# Patient Record
Sex: Female | Born: 1995 | Race: Black or African American | Hispanic: No | Marital: Single | Smoking: Current every day smoker
Health system: Southern US, Community
[De-identification: ages and names within clinical notes are randomized; demographics above are authoritative.]

---
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
## 2015-01-03 ENCOUNTER — Emergency Department
Admission: EM | Admit: 2015-01-03 | Discharge: 2015-01-03 | Disposition: A | Discharge status: Home / Self Care | Payer: Self-pay | Attending: Emergency Medicine | Admitting: Emergency Medicine

## 2015-01-03 ENCOUNTER — Encounter: Payer: Self-pay | Admitting: Emergency Medicine

## 2015-01-03 ENCOUNTER — Emergency Department: Payer: Self-pay

## 2015-01-03 DIAGNOSIS — Y998 Other external cause status: Secondary | ICD-10-CM | POA: Insufficient documentation

## 2015-01-03 DIAGNOSIS — Z79899 Other long term (current) drug therapy: Secondary | ICD-10-CM | POA: Insufficient documentation

## 2015-01-03 DIAGNOSIS — M7918 Myalgia, other site: Secondary | ICD-10-CM

## 2015-01-03 DIAGNOSIS — S3991XA Unspecified injury of abdomen, initial encounter: Secondary | ICD-10-CM | POA: Insufficient documentation

## 2015-01-03 DIAGNOSIS — S0990XA Unspecified injury of head, initial encounter: Secondary | ICD-10-CM | POA: Insufficient documentation

## 2015-01-03 DIAGNOSIS — M546 Pain in thoracic spine: Secondary | ICD-10-CM

## 2015-01-03 DIAGNOSIS — Y9389 Activity, other specified: Secondary | ICD-10-CM | POA: Insufficient documentation

## 2015-01-03 DIAGNOSIS — S299XXA Unspecified injury of thorax, initial encounter: Secondary | ICD-10-CM | POA: Insufficient documentation

## 2015-01-03 DIAGNOSIS — Y9289 Other specified places as the place of occurrence of the external cause: Secondary | ICD-10-CM | POA: Insufficient documentation

## 2015-01-03 MED ORDER — TRAMADOL HCL 50 MG PO TABS: 50.0000 mg | ORAL_TABLET | Freq: Four times a day (QID) | ORAL | Status: DC | PRN

## 2015-01-03 MED ORDER — TRAMADOL HCL 50 MG PO TABS
50.0000 mg | ORAL_TABLET | Freq: Once | ORAL | Status: AC
  Administered 2015-01-03: 50 mg via ORAL
  Filled 2015-01-03: qty 1
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
## 2015-01-03 NOTE — Discharge Instructions (Signed)
Back Pain, Adult °Low back pain is very common. About 1 in 5 people have back pain. The cause of low back pain is rarely dangerous. The pain often gets better over time. About half of people with a sudden onset of back pain feel better in just 2 weeks. About 8 in 10 people feel better by 6 weeks.  °CAUSES °Some common causes of back pain include: °· Strain of the muscles or ligaments supporting the spine. °· Wear and tear (degeneration) of the spinal discs. °· Arthritis. °· Direct injury to the back. °DIAGNOSIS °Most of the time, the direct cause of low back pain is not known. However, back pain can be treated effectively even when the exact cause of the pain is unknown. Answering your caregiver's questions about your overall health and symptoms is one of the most accurate ways to make sure the cause of your pain is not dangerous. If your caregiver needs more information, he or she may order lab work or imaging tests (X-rays or MRIs). However, even if imaging tests show changes in your back, this usually does not require surgery. °HOME CARE INSTRUCTIONS °For many people, back pain returns. Since low back pain is rarely dangerous, it is often a condition that people can learn to manage on their own.  °· Remain active. It is stressful on the back to sit or stand in one place. Do not sit, drive, or stand in one place for more than 30 minutes at a time. Take short walks on level surfaces as soon as pain allows. Try to increase the length of time you walk each day. °· Do not stay in bed. Resting more than 1 or 2 days can delay your recovery. °· Do not avoid exercise or work. Your body is made to move. It is not dangerous to be active, even though your back may hurt. Your back will likely heal faster if you return to being active before your pain is gone. °· Pay attention to your body when you  bend and lift. Many people have less discomfort when lifting if they bend their knees, keep the load close to their bodies, and  avoid twisting. Often, the most comfortable positions are those that put less stress on your recovering back. °· Find a comfortable position to sleep. Use a firm mattress and lie on your side with your knees slightly bent. If you lie on your back, put a pillow under your knees. °· Only take over-the-counter or prescription medicines as directed by your caregiver. Over-the-counter medicines to reduce pain and inflammation are often the most helpful. Your caregiver may prescribe muscle relaxant drugs. These medicines help dull your pain so you can more quickly return to your normal activities and healthy exercise. °· Put ice on the injured area. °· Put ice in a plastic bag. °· Place a towel between your skin and the bag. °· Leave the ice on for 15-20 minutes, 03-04 times a day for the first 2 to 3 days. After that, ice and heat may be alternated to reduce pain and spasms. °· Ask your caregiver about trying back exercises and gentle massage. This may be of some benefit. °· Avoid feeling anxious or stressed. Stress increases muscle tension and can worsen back pain. It is important to recognize when you are anxious or stressed and learn ways to manage it. Exercise is a great option. °SEEK MEDICAL CARE IF: °· You have pain that is not relieved with rest or medicine. °· You have pain that does not improve in 1 week. °· You have new symptoms. °· You are generally not feeling well. °SEEK   IMMEDIATE MEDICAL CARE IF:  °· You have pain that radiates from your back into your legs. °· You develop new bowel or bladder control problems. °· You have unusual weakness or numbness in your arms or legs. °· You develop nausea or vomiting. °· You develop abdominal pain. °· You feel faint. °Document Released: 05/28/2005 Document Revised: 11/27/2011 Document Reviewed: 09/29/2013 °ExitCare® Patient Information ©2015 [employer]. This information is not intended to replace advice given to you by your health care provider. Make sure you  discuss any questions you have with your health care provider. ° °Motor Vehicle Collision °It is common to have multiple bruises and sore muscles after a motor vehicle collision (MVC). These tend to feel worse for the first 24 hours. You may have the most stiffness and soreness over the first several hours. You may also feel worse when you wake up the first morning after your collision. After this point, you will usually begin to improve with each day. The speed of improvement often depends on the severity of the collision, the number of injuries, and the location and nature of these injuries. °HOME CARE INSTRUCTIONS °· Put ice on the injured area. °¨ Put ice in a plastic bag. °¨ Place a towel between your skin and the bag. °¨ Leave the ice on for 15-20 minutes, 3-4 times a day, or as directed by your health care provider. °· Drink enough fluids to keep your urine clear or pale yellow. Do not drink alcohol. °· Take a warm shower or bath once or twice a day. This will increase blood flow to sore muscles. °· You may return to activities as directed by your caregiver. Be careful when lifting, as this may aggravate neck or back pain. °· Only take over-the-counter or prescription medicines for pain, discomfort, or fever as directed by your caregiver. Do not use aspirin. This may increase bruising and bleeding. °SEEK IMMEDIATE MEDICAL CARE IF: °· You have numbness, tingling, or weakness in the arms or legs. °· You develop severe headaches not relieved with medicine. °· You have severe neck pain, especially tenderness in the middle of the back of your neck. °· You have changes in bowel or bladder control. °· There is increasing pain in any area of the body. °· You have shortness of breath, light-headedness, dizziness, or fainting. °· You have chest pain. °· You feel sick to your stomach (nauseous), throw up (vomit), or sweat. °· You have increasing abdominal discomfort. °· There is blood in your urine, stool, or  vomit. °· You have pain in your shoulder (shoulder strap areas). °· You feel your symptoms are getting worse. °MAKE SURE YOU: °· Understand these instructions. °· Will watch your condition. °· Will get help right away if you are not doing well or get worse. °Document Released: 05/28/2005 Document Revised: 10/12/2013 Document Reviewed: 10/25/2010 °ExitCare® Patient Information ©2015 [employer]. This information is not intended to replace advice given to you by your health care provider. Make sure you discuss any questions you have with your health care provider. ° °Musculoskeletal Pain °Musculoskeletal pain is muscle and boney aches and pains. These pains can occur in any part of the body. Your caregiver may treat you without knowing the cause of the pain. They may treat you if blood or urine tests, X-rays, and other tests were normal.  °CAUSES °There is often not a definite cause or reason for these pains. These pains may be caused by a type of germ (virus). The discomfort may also   come from overuse. Overuse includes working out too hard when your body is not fit. Boney aches also come from weather changes. Bone is sensitive to atmospheric pressure changes. °HOME CARE INSTRUCTIONS  °· Ask when your test results will be ready. Make sure you get your test results. °· Only take over-the-counter or prescription medicines for pain, discomfort, or fever as directed by your caregiver. If you were given medications for your condition, do not drive, operate machinery or power tools, or sign legal documents for 24 hours. Do not drink alcohol. Do not take sleeping pills or other medications that may interfere with treatment. °· Continue all activities unless the activities cause more pain. When the pain lessens, slowly resume normal activities. Gradually increase the intensity and duration of the activities or exercise. °· During periods of severe pain, bed rest may be helpful. Lay or sit in any position that is  comfortable. °· Putting ice on the injured area. °¨ Put ice in a bag. °¨ Place a towel between your skin and the bag. °¨ Leave the ice on for 15 to 20 minutes, 3 to 4 times a day. °· Follow up with your caregiver for continued problems and no reason can be found for the pain. If the pain becomes worse or does not go away, it may be necessary to repeat tests or do additional testing. Your caregiver may need to look further for a possible cause. °SEEK IMMEDIATE MEDICAL CARE IF: °· You have pain that is getting worse and is not relieved by medications. °· You develop chest pain that is associated with shortness or breath, sweating, feeling sick to your stomach (nauseous), or throw up (vomit). °· Your pain becomes localized to the abdomen. °· You develop any new symptoms that seem different or that concern you. °MAKE SURE YOU:  °· Understand these instructions. °· Will watch your condition. °· Will get help right away if you are not doing well or get worse. °Document Released: 05/28/2005 Document Revised: 08/20/2011 Document Reviewed: 01/30/2013 °ExitCare® Patient Information ©2015 [employer]. This information is not intended to replace advice given to you by your health care provider. Make sure you discuss any questions you have with your health care provider. ° °

## 2016-06-15 DIAGNOSIS — Z91410 Personal history of adult physical and sexual abuse: Secondary | ICD-10-CM | POA: Insufficient documentation
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
## 2018-02-24 DIAGNOSIS — F419 Anxiety disorder, unspecified: Secondary | ICD-10-CM | POA: Insufficient documentation

## 2018-05-27 LAB — HM HIV SCREENING LAB: HM HIV Screening: NEGATIVE

## 2018-07-08 DIAGNOSIS — E669 Obesity, unspecified: Secondary | ICD-10-CM | POA: Insufficient documentation

## 2018-07-08 DIAGNOSIS — Z8659 Personal history of other mental and behavioral disorders: Secondary | ICD-10-CM | POA: Insufficient documentation

## 2018-07-08 LAB — HM PAP SMEAR: HM Pap smear: NEGATIVE

## 2018-07-23 ENCOUNTER — Ambulatory Visit: Payer: Self-pay | Admitting: Physician Assistant

## 2018-07-23 NOTE — Progress Notes (Deleted)
Patient: Gabriella MosherReginnaria Lewis, Female    DOB: 04/02/1996, 23 y.o.   MRN: [id number] Visit Date: 07/23/2018  Today's Provider: Trey SailorsAdriana M Pollak, PA-C   No chief complaint on file.  Subjective:     Annual physical exam Gabriella Mayford Knifeurner is a 23 y.o. female who presents today for health maintenance and complete physical. She feels {DESC; WELL/FAIRLY WELL/POORLY:18703}. She reports exercising ***. She reports she is sleeping {DESC; WELL/FAIRLY WELL/POORLY:18703}.  -----------------------------------------------------------------   Review of Systems  Constitutional: Negative.   HENT: Negative.   Eyes: Negative.   Respiratory: Negative.   Cardiovascular: Negative.   Gastrointestinal: Negative.   Endocrine: Negative.   Genitourinary: Negative.   Musculoskeletal: Negative.   Skin: Negative.   Allergic/Immunologic: Negative.   Neurological: Negative.   Hematological: Negative.   Psychiatric/Behavioral: Negative.     Social History      She         Social History   Socioeconomic History  . Marital status: Not on file    Spouse name: Not on file  . Number of children: Not on file  . Years of education: Not on file  . Highest education level: Not on file  Occupational History  . Not on file  Social Needs  . Financial resource strain: Not on file  . Food insecurity:    Worry: Not on file    Inability: Not on file  . Transportation needs:    Medical: Not on file    Non-medical: Not on file  Tobacco Use  . Smoking status: Not on file  Substance and Sexual Activity  . Alcohol use: Not on file  . Drug use: Not on file  . Sexual activity: Not on file  Lifestyle  . Physical activity:    Days per week: Not on file    Minutes per session: Not on file  . Stress: Not on file  Relationships  . Social connections:    Talks on phone: Not on file    Gets together: Not on file    Attends religious service: Not on file    Active member of club or organization: Not on  file    Attends meetings of clubs or organizations: Not on file    Relationship status: Not on file  Other Topics Concern  . Not on file  Social History Narrative  . Not on file    No past medical history on file.   There are no active problems to display for this patient.   *** The histories are not reviewed yet. Please review them in the "History" navigator section and refresh this SmartLink.  Family History        No family status information on file.        Her family history is not on file.      Allergies not on file  No current outpatient medications on file.   No care team member to display    Objective:    Vitals: There were no vitals taken for this visit.  There were no vitals filed for this visit.   Physical Exam   Depression Screen No flowsheet data found.     Assessment & Plan:     Routine Health Maintenance and Physical Exam  Exercise Activities and Dietary recommendations Goals   None      There is no immunization history on file for this patient.  There are no preventive care reminders to display for this patient.   Discussed  health benefits of physical activity, and encouraged her to engage in regular exercise appropriate for her age and condition.    --------------------------------------------------------------------    Trey Sailors, PA-C  [facility]

## 2018-12-04 DIAGNOSIS — Z8659 Personal history of other mental and behavioral disorders: Secondary | ICD-10-CM

## 2018-12-04 DIAGNOSIS — Z6833 Body mass index (BMI) 33.0-33.9, adult: Secondary | ICD-10-CM

## 2018-12-04 DIAGNOSIS — E669 Obesity, unspecified: Secondary | ICD-10-CM

## 2018-12-04 DIAGNOSIS — F419 Anxiety disorder, unspecified: Secondary | ICD-10-CM

## 2018-12-04 DIAGNOSIS — Z91410 Personal history of adult physical and sexual abuse: Secondary | ICD-10-CM

## 2018-12-05 ENCOUNTER — Encounter: Payer: Self-pay | Admitting: Emergency Medicine

## 2018-12-08 ENCOUNTER — Ambulatory Visit: Payer: Self-pay

## 2019-01-23 ENCOUNTER — Ambulatory Visit: Payer: Medicaid Other

## 2019-01-27 ENCOUNTER — Ambulatory Visit: Payer: Self-pay | Admitting: Nurse Practitioner

## 2019-01-27 ENCOUNTER — Encounter: Payer: Self-pay | Admitting: Nurse Practitioner

## 2019-01-27 ENCOUNTER — Other Ambulatory Visit: Payer: Self-pay

## 2019-01-27 DIAGNOSIS — Z113 Encounter for screening for infections with a predominantly sexual mode of transmission: Secondary | ICD-10-CM

## 2019-01-27 DIAGNOSIS — N760 Acute vaginitis: Secondary | ICD-10-CM

## 2019-01-27 DIAGNOSIS — B9689 Other specified bacterial agents as the cause of diseases classified elsewhere: Secondary | ICD-10-CM

## 2019-01-27 MED ORDER — METRONIDAZOLE 500 MG PO TABS: 500.0000 mg | ORAL_TABLET | Freq: Two times a day (BID) | ORAL | 0 refills | Status: AC
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
## 2019-01-27 NOTE — Progress Notes (Signed)
Here today for STD screening. Accepts bloodwork.  Aarian Griffie, RN  Wet Prep results reviewed. Patient treated per provider orders. Keian Odriscoll, RN    

## 2019-01-30 LAB — WET PREP FOR TRICH, YEAST, CLUE
Trichomonas Exam: NEGATIVE
Yeast Exam: NEGATIVE

## 2019-03-18 ENCOUNTER — Ambulatory Visit: Payer: Medicaid Other

## 2019-04-01 ENCOUNTER — Ambulatory Visit: Payer: Medicaid Other

## 2019-04-17 ENCOUNTER — Ambulatory Visit: Payer: Medicaid Other

## 2019-04-20 ENCOUNTER — Ambulatory Visit: Payer: Medicaid Other

## 2019-04-24 ENCOUNTER — Ambulatory Visit: Payer: Self-pay | Admitting: Physician Assistant

## 2019-04-24 ENCOUNTER — Other Ambulatory Visit: Payer: Self-pay

## 2019-04-24 DIAGNOSIS — Z113 Encounter for screening for infections with a predominantly sexual mode of transmission: Secondary | ICD-10-CM

## 2019-04-24 LAB — WET PREP FOR TRICH, YEAST, CLUE
Trichomonas Exam: NEGATIVE
Yeast Exam: NEGATIVE

## 2019-04-24 LAB — PREGNANCY, URINE: Preg Test, Ur: NEGATIVE

## 2019-04-24 NOTE — Progress Notes (Signed)
UPT is negative today. Wet mount reviewed with provider and no treatment needed for wet mount per Antoine Primas, PA verbal order. Provider orders completed.Ronny Bacon, RN

## 2019-04-25 ENCOUNTER — Encounter: Payer: Self-pay | Admitting: Physician Assistant
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
## 2019-06-10 ENCOUNTER — Other Ambulatory Visit: Payer: Self-pay

## 2019-06-10 ENCOUNTER — Ambulatory Visit (LOCAL_COMMUNITY_HEALTH_CENTER): Payer: Medicaid Other | Admitting: Family Medicine

## 2019-06-10 ENCOUNTER — Encounter: Payer: Self-pay | Admitting: Family Medicine

## 2019-06-10 VITALS — BP 132/86 | Ht 61.0 in | Wt 184.0 lb

## 2019-06-10 DIAGNOSIS — N923 Ovulation bleeding: Secondary | ICD-10-CM

## 2019-06-10 DIAGNOSIS — Z3009 Encounter for other general counseling and advice on contraception: Secondary | ICD-10-CM | POA: Diagnosis not present

## 2019-06-10 DIAGNOSIS — B9689 Other specified bacterial agents as the cause of diseases classified elsewhere: Secondary | ICD-10-CM

## 2019-06-10 DIAGNOSIS — N760 Acute vaginitis: Secondary | ICD-10-CM

## 2019-06-10 DIAGNOSIS — Z113 Encounter for screening for infections with a predominantly sexual mode of transmission: Secondary | ICD-10-CM

## 2019-06-10 LAB — WET PREP FOR TRICH, YEAST, CLUE
Trichomonas Exam: NEGATIVE
Yeast Exam: NEGATIVE

## 2019-06-10 LAB — PREGNANCY, URINE: Preg Test, Ur: NEGATIVE

## 2019-06-10 MED ORDER — METRONIDAZOLE 500 MG PO TABS: 500.0000 mg | ORAL_TABLET | Freq: Two times a day (BID) | ORAL | 0 refills | Status: AC

## 2019-06-10 MED ORDER — NORGESTIMATE-ETH ESTRADIOL 0.25-35 MG-MCG PO TABS: 1.0000 | ORAL_TABLET | Freq: Every day | ORAL | 0 refills | Status: AC

## 2019-06-10 NOTE — Progress Notes (Signed)
WH problem visit  [facility]  Subjective:  Gabriella Norris is a 23 y.o. being seen today for   Chief Complaint  Patient presents with  . SEXUALLY TRANSMITTED DISEASE    STD screening  . Gynecologic Exam    concerns about menstraul bleeding    HPI  Client states that she was sen 04/2019 for discharge- states testing was negative.  States she continued to have disch with odor since 04/2019.  Client is concerned about her bleeding.  LNMP 04/27/2019  She states her menses began 05/25/2019 but it was just spotting and lighter.  On 12/20/202 states blood flow was heavier and she began to have nickel-sized clots today,  She denies cramping, lower abd pain, fever/n/v. Client states that she has 1 partner.  Last unprotected sex was end of November.   She did note that she had pain with sex.  Client states that she and partner may want to conceive in the next year so don't use birth control.     Does the patient have a current or past history of drug use? No   No components found for: HCV]   Health Maintenance Due  Topic Date Due  . TETANUS/TDAP  04/12/2015  . INFLUENZA VACCINE  01/10/2019    ROS  The following portions of the patient's history were reviewed and updated as appropriate: allergies, current medications, past family history, past medical history, past social history, past surgical history and problem list. Problem list updated.   See flowsheet for other program required questions.  Objective:   Vitals:   06/10/19 0857  BP: 132/86  Weight: 184 lb (83.5 kg)  Height: 5\' 1"  (1.549 m)    Physical Exam Constitutional:      Appearance: She is obese.  Abdominal:     Palpations: Abdomen is soft. There is no mass.     Tenderness: There is no abdominal tenderness.     Hernia: There is no hernia in the left inguinal area or right inguinal area.  Genitourinary:    Pubic Area: No rash or pubic lice.      Labia:      Right: No rash, tenderness, lesion or injury.        Left: No rash, tenderness, lesion or injury.      Urethra: No prolapse or urethral lesion.     Vagina: Bleeding present. No tenderness or lesions.     Cervix: No cervical motion tenderness.     Uterus: Normal.      Adnexa: Right adnexa normal and left adnexa normal.  Musculoskeletal:     Cervical back: Neck supple. No tenderness.  Lymphadenopathy:     Cervical: No cervical adenopathy.     Lower Body: No right inguinal adenopathy. No left inguinal adenopathy.  Skin:    General: Skin is warm and dry.     Findings: No lesion or rash.  Neurological:     Mental Status: She is alert.     Assessment and Plan:  Gabriella Norris is a 23 y.o. female presenting to the [facility] Department for a Women's Health problem visit  1. General counseling and advice on contraceptive management  2. Screening examination for venereal disease - WET PREP FOR TRICH, YEAST, CLUE - Chlamydia/Gonorrhea [place] [facility]  3. Intermenstrual bleeding - Pregnancy, urine-  Negative Offered client OCPs to treat the bleeding.  Client agrees to this plan.  Sprintec take 1 active tablet x 6 weeks.  Explained  to client not to take the 4th week of the OCP pack.  Co to use condoms for back-up.  If client decides to continue with OCPs for [facility] she will contact clinic for RF.  4. BV (bacterial vaginosis) - metroNIDAZOLE (FLAGYL) 500 MG tablet; Take 1 tablet (500 mg total) by mouth 2 (two) times daily for 7 days.  Dispense: 14 tablet; Refill: 0     No follow-ups on file.  No future appointments.  Hassell Done, FNP

## 2019-06-10 NOTE — Progress Notes (Signed)
Pt here as she is having concerns with menstrual bleeding that started 05/25/2019 and is still having light bleeding now. Pt also reports odor and interest in STD screening. Pt reports she is not interested in birth control at this time and is not currently using a BCM.Marland KitchenRonny Bacon, RN

## 2019-06-10 NOTE — Progress Notes (Signed)
Urine PT is negative today. Wet mount reviewed and pt treated for BV per standing order and per Hassell Done, FNP order. Pt received Sprintec #2 packs per provider order. Counseled pt per provider orders and pt states understanding. Provider orders completed.Ronny Bacon, RN

## 2019-09-17 ENCOUNTER — Ambulatory Visit: Payer: Medicaid Other

## 2019-09-21 ENCOUNTER — Ambulatory Visit: Payer: Medicaid Other

## 2019-12-02 ENCOUNTER — Ambulatory Visit: Payer: Medicaid Other

## 2019-12-03 ENCOUNTER — Ambulatory Visit: Payer: Medicaid Other

## 2019-12-31 ENCOUNTER — Ambulatory Visit: Payer: Medicaid Other

## 2020-03-07 ENCOUNTER — Ambulatory Visit: Payer: Self-pay | Admitting: Advanced Practice Midwife

## 2020-03-07 ENCOUNTER — Encounter: Payer: Self-pay | Admitting: Advanced Practice Midwife

## 2020-03-07 ENCOUNTER — Other Ambulatory Visit: Payer: Self-pay

## 2020-03-07 DIAGNOSIS — Z113 Encounter for screening for infections with a predominantly sexual mode of transmission: Secondary | ICD-10-CM

## 2020-03-07 DIAGNOSIS — F17200 Nicotine dependence, unspecified, uncomplicated: Secondary | ICD-10-CM

## 2020-03-07 DIAGNOSIS — B9689 Other specified bacterial agents as the cause of diseases classified elsewhere: Secondary | ICD-10-CM

## 2020-03-07 DIAGNOSIS — N760 Acute vaginitis: Secondary | ICD-10-CM

## 2020-03-07 LAB — WET PREP FOR TRICH, YEAST, CLUE
Trichomonas Exam: NEGATIVE
Yeast Exam: NEGATIVE

## 2020-03-07 MED ORDER — METRONIDAZOLE 500 MG PO TABS: 500.0000 mg | ORAL_TABLET | Freq: Two times a day (BID) | ORAL | 0 refills | Status: AC
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
## 2020-03-07 NOTE — Progress Notes (Signed)
Post:  RN reviewed wet mount with patient. Patient treated for BV for S.O. Provider orders complete.  Harvie Heck, RN

## 2020-05-28 ENCOUNTER — Encounter: Payer: Self-pay | Admitting: Emergency Medicine

## 2020-05-28 ENCOUNTER — Other Ambulatory Visit: Payer: Self-pay

## 2020-05-28 ENCOUNTER — Emergency Department: Payer: Self-pay

## 2020-05-28 ENCOUNTER — Emergency Department
Admission: EM | Admit: 2020-05-28 | Discharge: 2020-05-28 | Disposition: A | Discharge status: Home / Self Care | Payer: Self-pay | Attending: Emergency Medicine | Admitting: Emergency Medicine

## 2020-05-28 DIAGNOSIS — F17210 Nicotine dependence, cigarettes, uncomplicated: Secondary | ICD-10-CM | POA: Insufficient documentation

## 2020-05-28 DIAGNOSIS — J101 Influenza due to other identified influenza virus with other respiratory manifestations: Secondary | ICD-10-CM | POA: Insufficient documentation

## 2020-05-28 DIAGNOSIS — Z20822 Contact with and (suspected) exposure to covid-19: Secondary | ICD-10-CM | POA: Insufficient documentation

## 2020-05-28 LAB — CBC WITH DIFFERENTIAL/PLATELET
Abs Immature Granulocytes: 0.03 10*3/uL (ref 0.00–0.07)
Basophils Absolute: 0 10*3/uL (ref 0.0–0.1)
Basophils Relative: 0 %
Eosinophils Absolute: 0 10*3/uL (ref 0.0–0.5)
Eosinophils Relative: 1 %
HCT: 38.2 % (ref 36.0–46.0)
Hemoglobin: 12.8 g/dL (ref 12.0–15.0)
Immature Granulocytes: 0 %
Lymphocytes Relative: 8 %
Lymphs Abs: 0.6 10*3/uL — ABNORMAL LOW (ref 0.7–4.0)
MCH: 29.2 pg (ref 26.0–34.0)
MCHC: 33.5 g/dL (ref 30.0–36.0)
MCV: 87 fL (ref 80.0–100.0)
Monocytes Absolute: 0.6 10*3/uL (ref 0.1–1.0)
Monocytes Relative: 8 %
Neutro Abs: 6.5 10*3/uL (ref 1.7–7.7)
Neutrophils Relative %: 83 %
Platelets: 310 10*3/uL (ref 150–400)
RBC: 4.39 MIL/uL (ref 3.87–5.11)
RDW: 12.9 % (ref 11.5–15.5)
WBC: 7.9 10*3/uL (ref 4.0–10.5)
nRBC: 0 % (ref 0.0–0.2)

## 2020-05-28 LAB — BASIC METABOLIC PANEL
Anion gap: 8 (ref 5–15)
BUN: 10 mg/dL (ref 6–20)
CO2: 23 mmol/L (ref 22–32)
Calcium: 8.2 mg/dL — ABNORMAL LOW (ref 8.9–10.3)
Chloride: 105 mmol/L (ref 98–111)
Creatinine, Ser: 0.75 mg/dL (ref 0.44–1.00)
GFR, Estimated: >60 mL/min (ref >60)
Glucose, Bld: 89 mg/dL (ref 70–99)
Potassium: 3.8 mmol/L (ref 3.5–5.1)
Sodium: 136 mmol/L (ref 135–145)

## 2020-05-28 LAB — RESP PANEL BY RT-PCR (FLU A&B, COVID) ARPGX2
Influenza A by PCR: POSITIVE — AB
Influenza B by PCR: NEGATIVE
SARS Coronavirus 2 by RT PCR: NEGATIVE

## 2020-05-28 MED ORDER — OSELTAMIVIR PHOSPHATE 75 MG PO CAPS
75.0000 mg | ORAL_CAPSULE | Freq: Once | ORAL | Status: AC
  Administered 2020-05-28: 22:00:00 75 mg via ORAL
  Filled 2020-05-28: qty 1

## 2020-05-28 MED ORDER — OSELTAMIVIR PHOSPHATE 75 MG PO CAPS: 75.0000 mg | ORAL_CAPSULE | Freq: Two times a day (BID) | ORAL | 0 refills | Status: AC

## 2020-05-28 MED ORDER — ACETAMINOPHEN 500 MG PO TABS
ORAL_TABLET | ORAL | Status: AC
  Filled 2020-05-28: qty 2

## 2020-05-28 MED ORDER — ACETAMINOPHEN 500 MG PO TABS
1000.0000 mg | ORAL_TABLET | Freq: Once | ORAL | Status: AC
  Administered 2020-05-28: 20:00:00 1000 mg via ORAL

## 2020-05-28 MED ORDER — IBUPROFEN 600 MG PO TABS: 600.0000 mg | ORAL_TABLET | Freq: Four times a day (QID) | ORAL | 0 refills | Status: AC | PRN

## 2020-05-28 MED ORDER — BENZONATATE 100 MG PO CAPS: 100.0000 mg | ORAL_CAPSULE | Freq: Three times a day (TID) | ORAL | 0 refills | Status: AC | PRN

## 2020-05-28 MED ORDER — SODIUM CHLORIDE 0.9 % IV BOLUS
1000.0000 mL | Freq: Once | INTRAVENOUS | Status: AC
  Administered 2020-05-28: 20:00:00 1000 mL via INTRAVENOUS
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
## 2020-05-28 NOTE — ED Triage Notes (Signed)
Pt reports yesterday started with cough, fever and bodyaches

## 2020-08-10 ENCOUNTER — Ambulatory Visit: Payer: Medicaid Other

## 2021-01-20 ENCOUNTER — Ambulatory Visit: Payer: Medicaid Other

## 2021-02-08 ENCOUNTER — Encounter: Payer: Self-pay | Admitting: Family Medicine

## 2021-02-08 ENCOUNTER — Ambulatory Visit: Payer: Self-pay | Admitting: Family Medicine

## 2021-02-08 ENCOUNTER — Other Ambulatory Visit: Payer: Self-pay

## 2021-02-08 DIAGNOSIS — Z113 Encounter for screening for infections with a predominantly sexual mode of transmission: Secondary | ICD-10-CM

## 2021-02-08 DIAGNOSIS — A599 Trichomoniasis, unspecified: Secondary | ICD-10-CM

## 2021-02-08 LAB — WET PREP FOR TRICH, YEAST, CLUE
Trichomonas Exam: POSITIVE — AB
Yeast Exam: NEGATIVE

## 2021-02-08 MED ORDER — METRONIDAZOLE 500 MG PO TABS: 500.0000 mg | ORAL_TABLET | Freq: Two times a day (BID) | ORAL | 0 refills | Status: AC
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
## 2021-02-12 NOTE — Progress Notes (Signed)
Chart reviewed by Pharmacist  Suzanne Walker PharmD, Contract Pharmacist at [facility]  

## 2021-05-02 ENCOUNTER — Ambulatory Visit: Payer: Medicaid Other

## 2021-05-25 ENCOUNTER — Ambulatory Visit: Payer: Medicaid Other

## 2021-07-14 ENCOUNTER — Encounter: Payer: Self-pay | Admitting: Family Medicine

## 2021-07-14 ENCOUNTER — Other Ambulatory Visit: Payer: Self-pay

## 2021-07-14 ENCOUNTER — Ambulatory Visit: Payer: Self-pay | Admitting: Family Medicine

## 2021-07-14 DIAGNOSIS — B9689 Other specified bacterial agents as the cause of diseases classified elsewhere: Secondary | ICD-10-CM

## 2021-07-14 DIAGNOSIS — N760 Acute vaginitis: Secondary | ICD-10-CM

## 2021-07-14 DIAGNOSIS — Z113 Encounter for screening for infections with a predominantly sexual mode of transmission: Secondary | ICD-10-CM

## 2021-07-14 LAB — WET PREP FOR TRICH, YEAST, CLUE
Trichomonas Exam: NEGATIVE
Yeast Exam: NEGATIVE

## 2021-07-14 MED ORDER — METRONIDAZOLE 500 MG PO TABS: 500.0000 mg | ORAL_TABLET | Freq: Two times a day (BID) | ORAL | 0 refills | Status: AC

## 2021-07-14 NOTE — Patient Instructions (Signed)
Steps to prevent BV and yeast: Wear all-cotton underwear Sleep without underwear Take showers instead of baths Wear loose fitting clothing, especially during warm/hot weather Use a hair dryer on low after bathing to dry the area Avoid scented soaps and body washes Do not douche May try over the counter probiotics or boric acid gel or suppositories Stop smoking  

## 2021-07-14 NOTE — Progress Notes (Signed)
Pt here for STD screening.  Wet mount results reviewed and medication dispensed, per provider orders.  Pt declined condoms.  Berdie Ogren, RN
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
## 2022-01-02 IMAGING — DX DG CHEST 1V
1 series · 1 of 1 positions shown · non-contrast
Comparison: None.

CLINICAL DATA: Fever, cough

EXAM:
CHEST  1 VIEW

[chest ap]
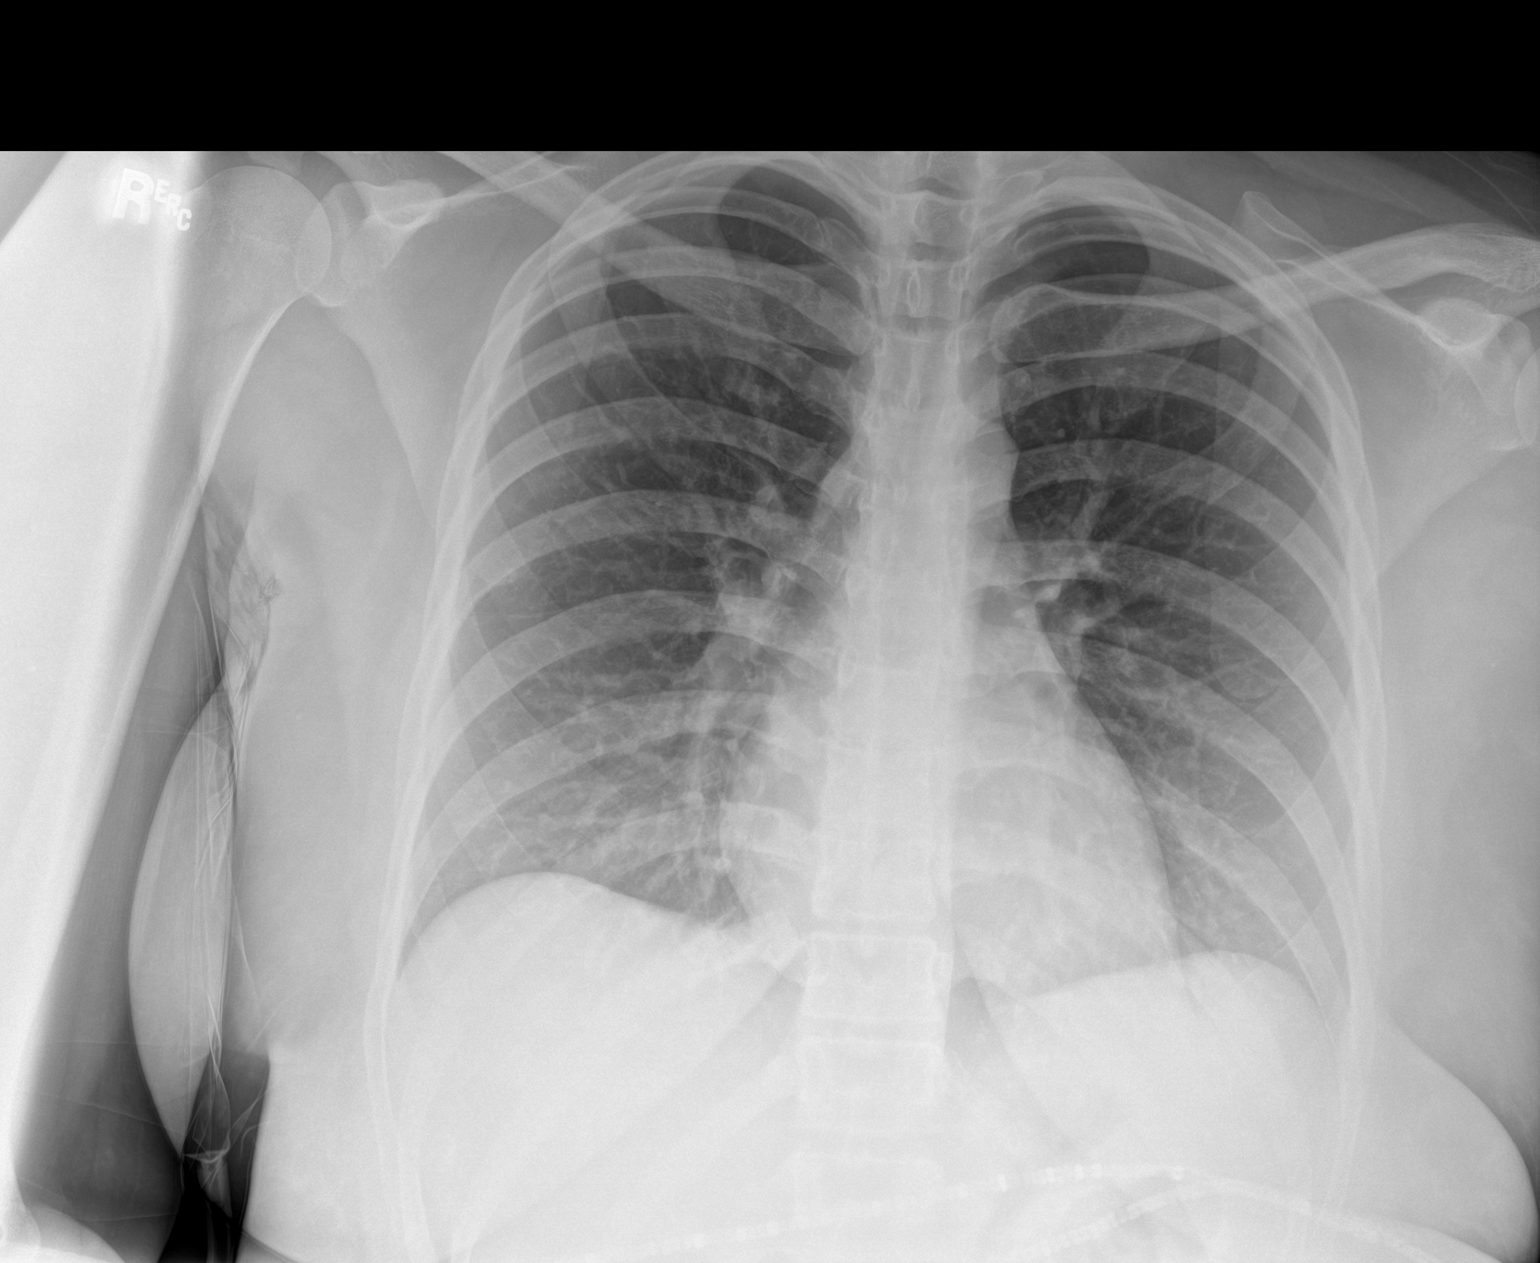

[1 of 1 positions shown; findings below may reference images not displayed]

FINDINGS: Accounting for body habitus, the lungs are clear. No consolidation,
features of edema, pneumothorax, or effusion. Pulmonary vascularity
is normally distributed. The cardiomediastinal contours are
unremarkable. No acute osseous or soft tissue abnormality.
IMPRESSION: No acute cardiopulmonary abnormality.
